# Patient Record
Sex: Male | Born: 1995 | Race: White | Hispanic: No | Marital: Single | Smoking: Never smoker
Health system: Southern US, Community
[De-identification: ages and names within clinical notes are randomized; demographics above are authoritative.]

## PROBLEM LIST (undated history)

## (undated) DIAGNOSIS — J4520 Mild intermittent asthma, uncomplicated: Secondary | ICD-10-CM

## (undated) DIAGNOSIS — Z8739 Personal history of other diseases of the musculoskeletal system and connective tissue: Secondary | ICD-10-CM

## (undated) DIAGNOSIS — J302 Other seasonal allergic rhinitis: Secondary | ICD-10-CM

## (undated) DIAGNOSIS — J301 Allergic rhinitis due to pollen: Secondary | ICD-10-CM

## (undated) DIAGNOSIS — F419 Anxiety disorder, unspecified: Secondary | ICD-10-CM

## (undated) DIAGNOSIS — F329 Major depressive disorder, single episode, unspecified: Secondary | ICD-10-CM

## (undated) DIAGNOSIS — H53002 Unspecified amblyopia, left eye: Secondary | ICD-10-CM

## (undated) DIAGNOSIS — K645 Perianal venous thrombosis: Secondary | ICD-10-CM

## (undated) DIAGNOSIS — F32A Depression, unspecified: Secondary | ICD-10-CM

## (undated) HISTORY — DX: Unspecified amblyopia, left eye: H53.002

## (undated) HISTORY — DX: Depression, unspecified: F32.A

## (undated) HISTORY — DX: Anxiety disorder, unspecified: F41.9

## (undated) HISTORY — DX: Allergic rhinitis due to pollen: J30.1

## (undated) HISTORY — DX: Perianal venous thrombosis: K64.5

## (undated) HISTORY — DX: Other seasonal allergic rhinitis: J30.2

## (undated) HISTORY — DX: Major depressive disorder, single episode, unspecified: F32.9

## (undated) HISTORY — DX: Mild intermittent asthma, uncomplicated: J45.20

## (undated) HISTORY — DX: Personal history of other diseases of the musculoskeletal system and connective tissue: Z87.39

---
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
## 2016-01-19 DIAGNOSIS — K645 Perianal venous thrombosis: Secondary | ICD-10-CM

## 2016-01-19 HISTORY — DX: Perianal venous thrombosis: K64.5

## 2016-01-25 ENCOUNTER — Encounter: Payer: Self-pay | Admitting: Family Medicine

## 2016-01-25 ENCOUNTER — Ambulatory Visit (INDEPENDENT_AMBULATORY_CARE_PROVIDER_SITE_OTHER): Payer: BLUE CROSS/BLUE SHIELD | Admitting: Family Medicine

## 2016-01-25 VITALS — BP 146/98 | HR 66 | Temp 98.1°F | Resp 16 | Ht 71.0 in | Wt 146.0 lb

## 2016-01-25 DIAGNOSIS — K645 Perianal venous thrombosis: Secondary | ICD-10-CM | POA: Diagnosis not present

## 2016-01-25 NOTE — Progress Notes (Signed)
OFFICE VISIT  01/25/2016   CC:  Chief Complaint  Patient presents with  . Rectal Pain    no pain at this moment start 4 days ago   HPI:    Patient is a 20 y.o. Caucasian male who presents for recent rectal pain. Onset of anal pain 4 d/a, felt a bump.  He says it felt like it was oozing some substance but not bleeding. Now, says it has gone down a lot and doesn't hurt at all except for when he wipes.  Past Medical History:  Diagnosis Date  . Amblyopia ex anopsia of left eye   . Anxiety   . Depression   . Hay fever   . History of Osgood-Schlatter disease    bilat     Past Surgical History:  Procedure Laterality Date  . TONSILLECTOMY  2006  . TYMPANOSTOMY TUBE PLACEMENT  2000  . WRIST FRACTURE SURGERY  2010    Outpatient Medications Prior to Visit  Medication Sig Dispense Refill  . cetirizine (ZYRTEC) 10 MG tablet Take 10 mg by mouth daily.    . Loratadine (CLARITIN PO) Take by mouth as needed.    . triamcinolone (NASACORT ALLERGY 24HR) 55 MCG/ACT AERO nasal inhaler Place 1 spray into the nose daily as needed.     No facility-administered medications prior to visit.     No Known Allergies  ROS As per HPI  PE: Blood pressure (!) 146/98, pulse 66, temperature 98.1 F (36.7 C), temperature source Oral, resp. rate 16, height 5\' 11"  (1.803 m), weight 146 lb (66.2 kg), SpO2 99 %.  Examination chaperoned by Zane HeraldJavier Guijoza, CMA. Gen: Alert, well appearing.  Patient is oriented to person, place, time, and situation. Anal exam: small, violaceous-colored, non-tender thrombosed hemorrhoid at 12 o'clock position.  No drainage or bleeding.    LABS:  none  IMPRESSION AND PLAN:  Thrombosed external hemorrhoid, resolving spontaneously. Encouraged daily 20 min sitz bath for the next 4-5 days. Signs/symptoms to call or return for were reviewed and pt expressed understanding.  An After Visit Summary was printed and given to the patient.  FOLLOW UP: Return if symptoms worsen  or fail to improve.  Signed:  Santiago BumpersPhil Chanika Byland, MD           01/25/2016

## 2016-01-25 NOTE — Patient Instructions (Signed)
Buy over the counter "Sitz Bath" and soak the area for 20 minutes at least once a day for the next 4-5 days.

## 2017-04-25 ENCOUNTER — Encounter: Payer: Self-pay | Admitting: Family Medicine

## 2017-04-25 ENCOUNTER — Ambulatory Visit: Payer: BLUE CROSS/BLUE SHIELD | Admitting: Family Medicine

## 2017-04-25 VITALS — BP 112/75 | HR 88 | Temp 97.9°F | Resp 16 | Wt 149.0 lb

## 2017-04-25 DIAGNOSIS — Z8739 Personal history of other diseases of the musculoskeletal system and connective tissue: Secondary | ICD-10-CM

## 2017-04-25 DIAGNOSIS — M21962 Unspecified acquired deformity of left lower leg: Secondary | ICD-10-CM | POA: Diagnosis not present

## 2017-04-25 DIAGNOSIS — M25561 Pain in right knee: Secondary | ICD-10-CM | POA: Diagnosis not present

## 2017-04-25 DIAGNOSIS — J302 Other seasonal allergic rhinitis: Secondary | ICD-10-CM | POA: Diagnosis not present

## 2017-04-25 DIAGNOSIS — G8929 Other chronic pain: Secondary | ICD-10-CM | POA: Diagnosis not present

## 2017-04-25 DIAGNOSIS — M21961 Unspecified acquired deformity of right lower leg: Secondary | ICD-10-CM | POA: Diagnosis not present

## 2017-04-25 DIAGNOSIS — J0100 Acute maxillary sinusitis, unspecified: Secondary | ICD-10-CM

## 2017-04-25 DIAGNOSIS — M25562 Pain in left knee: Secondary | ICD-10-CM

## 2017-04-25 MED ORDER — FLUTICASONE PROPIONATE 50 MCG/ACT NA SUSP: 2.0000 | Freq: Every day | NASAL | 6 refills | Status: DC

## 2017-04-25 MED ORDER — AMOXICILLIN 875 MG PO TABS: 875.0000 mg | ORAL_TABLET | Freq: Two times a day (BID) | ORAL | 0 refills | Status: AC

## 2017-04-25 NOTE — Progress Notes (Signed)
OFFICE VISIT  04/27/2017   CC:  Chief Complaint  Patient presents with  . Allergic Rhinitis     sneezing, runny nose, head congestion   HPI:    Patient is a 21 y.o. Caucasian male who presents for "allergies". Nasal congestion, sneezing, blowing nose all day, forehead headaches.  Face pressure+. Works around a lot of dust in [employer].  Onset about 3 wks ago, getting worse. Claritin not helping. Onset of ST yesterday.  No cough or fevers or wheezing.  No pain in face or upper teeth. Feels change in sense of smell.  Taste normal.    Both knees hurt, much of the time brought on by bending deeply while working or perching on knee briefly while working. No redness or effusion or heat.  No hx of injury to either knee. Hx of osgood schlatter dz.    Past Medical History:  Diagnosis Date  . Amblyopia ex anopsia of left eye   . Anxiety   . Depression   . Hay fever   . History of Osgood-Schlatter disease    bilat     Past Surgical History:  Procedure Laterality Date  . TONSILLECTOMY  2006  . TYMPANOSTOMY TUBE PLACEMENT  2000  . WRIST FRACTURE SURGERY  2010    Outpatient Medications Prior to Visit  Medication Sig Dispense Refill  . Loratadine (CLARITIN PO) Take by mouth as needed.    . cetirizine (ZYRTEC) 10 MG tablet Take 10 mg by mouth daily.    Marland Kitchen. triamcinolone (NASACORT ALLERGY 24HR) 55 MCG/ACT AERO nasal inhaler Place 1 spray into the nose daily as needed.     No facility-administered medications prior to visit.     No Known Allergies  ROS As per HPI  PE: Blood pressure 112/75, pulse 88, temperature 97.9 F (36.6 C), temperature source Oral, resp. rate 16, weight 149 lb (67.6 kg), SpO2 100 %. VS: noted--normal. Gen: alert, NAD, NONTOXIC APPEARING. HEENT: eyes without injection, drainage, or swelling.  Ears: EACs clear, TMs with normal light reflex and landmarks.  Nose: Clear rhinorrhea, with some dried, crusty exudate adherent to mildly injected mucosa.  No  purulent d/c.  No paranasal sinus TTP.  No facial swelling.  Throat and mouth without focal lesion.  No pharyngial swelling, erythema, or exudate.   Neck: supple, no LAD.   LUNGS: CTA bilat, nonlabored resps.   CV: RRR, no m/r/g. EXT: no c/c/e SKIN: no rash  Knees: large bony protrusion deformity at tibial tuberosity bilat, L>R.  No tenderness, no soft tissue swelling or effusion, no instability.  ROM intact.  Patellar grind neg.    LABS:  none  IMPRESSION AND PLAN:  1) Allergic rhinitis with superimposed acute sinusitis: amoxil 875mg  bid x 10d.  Add flonase qd.  Continue claritin.  2) Bilat knee bony deformities: at the areas of tibial tuberosity bilat, secondary to hx of osgood schlatter dz. Bothersome knee pains frequently--related to this problem. Check x-ray of both knees. Referred to ortho: Dr. Romeo [place]Harrison in [place].  An After Visit Summary was printed and given to the patient.  FOLLOW UP: No Follow-up on file.PRN  Signed:  Santiago BumpersPhil Casey Maxfield, MD           04/27/2017

## 2017-05-01 ENCOUNTER — Ambulatory Visit (HOSPITAL_COMMUNITY)
Admission: RE | Admit: 2017-05-01 | Discharge: 2017-05-01 | Disposition: A | Discharge status: Home / Self Care | Payer: BLUE CROSS/BLUE SHIELD | Source: Ambulatory Visit | Attending: Family Medicine | Admitting: Family Medicine

## 2017-05-01 DIAGNOSIS — Z8739 Personal history of other diseases of the musculoskeletal system and connective tissue: Secondary | ICD-10-CM | POA: Diagnosis not present

## 2017-05-01 DIAGNOSIS — G8929 Other chronic pain: Secondary | ICD-10-CM | POA: Diagnosis not present

## 2017-05-01 DIAGNOSIS — M25562 Pain in left knee: Secondary | ICD-10-CM | POA: Diagnosis not present

## 2017-05-01 DIAGNOSIS — M25561 Pain in right knee: Secondary | ICD-10-CM | POA: Diagnosis not present

## 2017-05-07 ENCOUNTER — Ambulatory Visit: Payer: BLUE CROSS/BLUE SHIELD | Admitting: Orthopaedic Surgery

## 2017-05-07 ENCOUNTER — Encounter: Payer: Self-pay | Admitting: Orthopaedic Surgery

## 2017-05-07 VITALS — BP 120/76 | HR 85 | Temp 97.3°F | Ht 72.0 in | Wt 150.0 lb

## 2017-05-07 DIAGNOSIS — G8929 Other chronic pain: Secondary | ICD-10-CM

## 2017-05-07 DIAGNOSIS — M25562 Pain in left knee: Secondary | ICD-10-CM | POA: Diagnosis not present

## 2017-05-07 DIAGNOSIS — M25561 Pain in right knee: Secondary | ICD-10-CM | POA: Diagnosis not present

## 2017-05-07 MED ORDER — NAPROXEN 500 MG PO TABS: 500.0000 mg | ORAL_TABLET | Freq: Two times a day (BID) | ORAL | 5 refills | Status: DC

## 2017-05-07 NOTE — Patient Instructions (Signed)

## 2017-05-07 NOTE — Progress Notes (Signed)
Subjective:    Patient ID: Kyle MooresDustin C Rahimi, male    DOB: 10/24/1995, 21 y.o.   MRN: [id number]  HPI He has had pain in the knees for years. He had Osgood-Schlatters on the left knee and has a bony prominence.  He has pain when he squats for a while. 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  He has no trauma.  He has had x-rays which were negative.  He has tried some Advil at times and it helps.  He has no swelling, no giving way, no locking, no redness. He has no other joint pains.  He has no trauma.  He has no popping, no redness.     Review of Systems  Musculoskeletal: Positive for arthralgias.  Allergic/Immunologic: Positive for environmental allergies.  All other systems reviewed and are negative.  Past Medical History:  Diagnosis Date  . Amblyopia ex anopsia of left eye   . Anxiety   . Depression   . Hay fever   . History of Osgood-Schlatter disease    bilat     Past Surgical History:  Procedure Laterality Date  . TONSILLECTOMY  2006  . TYMPANOSTOMY TUBE PLACEMENT  2000  . WRIST FRACTURE SURGERY  2010    Current Outpatient Medications on File Prior to Visit  Medication Sig Dispense Refill  . fluticasone (FLONASE) 50 MCG/ACT nasal spray Place 2 sprays into both nostrils daily. 16 g 6  . Loratadine (CLARITIN PO) Take by mouth as needed.    . cetirizine (ZYRTEC) 10 MG tablet Take 10 mg by mouth daily.    Marland Kitchen. triamcinolone (NASACORT ALLERGY 24HR) 55 MCG/ACT AERO nasal inhaler Place 1 spray into the nose daily as needed.     No current facility-administered medications on file prior to visit.     Social History   Socioeconomic History  . Marital status: Single    Spouse name: Not on file  . Number of children: Not on file  . Years of education: Not on file  . Highest education level: Not on file  Social Needs  . Financial resource strain: Not on file  . Food insecurity - worry: Not on file  . Food insecurity - inability: Not on file    . Transportation needs - medical: Not on file  . Transportation needs - non-medical: Not on file  Occupational History  . Not on file  Tobacco Use  . Smoking status: Never Smoker  . Smokeless tobacco: Never Used  Substance and Sexual Activity  . Alcohol use: No  . Drug use: No  . Sexual activity: No  Other Topics Concern  . Not on file  Social History Narrative   [place]ives [place], dad in [place], [place].  His older brother committed suicide.   [employer] grad.   No tob.   Alc: none   Drugs: no    Family History  Problem Relation Age of Onset  . Alcohol abuse Mother   . Drug abuse Mother   . Cancer Mother   . Depression Brother   . Allergies Brother   . Suicidality Brother   . Cancer Maternal Grandfather   . Hypertension Paternal Grandmother   . Cancer Paternal Grandfather   . Hypertension Paternal Grandfather   . Diabetes Paternal Grandfather   . Cancer Maternal Uncle     BP 120/76   Pulse 85   Temp (!) 97.3 F (36.3 C)   Ht 6' (1.829 m)  Wt 150 lb (68 kg)   BMI 20.34 kg/m      Objective:   Physical Exam  Constitutional: He is oriented to person, place, and time. He appears well-developed and well-nourished.  HENT:  Head: Normocephalic and atraumatic.  Eyes: Conjunctivae and EOM are normal. Pupils are equal, round, and reactive to light.  Neck: Normal range of motion. Neck supple.  Cardiovascular: Normal rate, regular rhythm and intact distal pulses.  Pulmonary/Chest: Effort normal.  Abdominal: Soft.  Musculoskeletal: He exhibits tenderness (Both knees have full ROM, no crepitus, no pain.  He has prominent tibial tuburcle on the left knee but no pain or redness.  Gait normal.  Knees stable.  NV intact.).  Neurological: He is alert and oriented to person, place, and time. He has normal reflexes. He displays normal reflexes. No cranial nerve deficit. He exhibits normal muscle tone. Coordination normal.  Skin: Skin is warm and dry.  Psychiatric: He has a  normal mood and affect. His behavior is normal. Judgment and thought content normal.  Vitals reviewed.   I reviewed his prior x-rays and reports and notes from his family doctor.      Assessment & Plan:   Encounter Diagnosis  Name Primary?  . Bilateral chronic knee pain Yes   I will begin Naprosyn 500 po bid pc.  Precautions given.  He may want to consider other types of work long term.  Return in three weeks.  Call if any problem.  Precautions discussed.   Electronically Signed Darreld McleanWayne Gracen Southwell, MD 12/19/20189:00 AM

## 2017-05-28 ENCOUNTER — Ambulatory Visit: Payer: BLUE CROSS/BLUE SHIELD | Admitting: Orthopaedic Surgery

## 2017-05-28 ENCOUNTER — Encounter: Payer: Self-pay | Admitting: Orthopaedic Surgery

## 2017-05-28 VITALS — BP 127/81 | HR 84 | Ht 72.0 in | Wt 150.0 lb

## 2017-05-28 DIAGNOSIS — M25562 Pain in left knee: Secondary | ICD-10-CM

## 2017-05-28 DIAGNOSIS — M25561 Pain in right knee: Secondary | ICD-10-CM

## 2017-05-28 DIAGNOSIS — G8929 Other chronic pain: Secondary | ICD-10-CM

## 2017-05-28 NOTE — Progress Notes (Signed)
CC:  My knee is much better  He took the Naprosyn.  His knee pain is much improved.  He has no pain today, full ROM, normal gait, no swelling.  I have told him he can stop the Naprosyn.  He can take Aleve in the future if any problem.  I will see him as needed.  Encounter Diagnosis  Name Primary?  . Bilateral chronic knee pain Yes   Call if any problem.  Precautions discussed.   Electronically Signed Darreld McleanWayne Oral Hallgren, MD 1/9/20199:13 AM

## 2017-06-02 ENCOUNTER — Encounter: Payer: Self-pay | Admitting: Family Medicine

## 2017-07-28 ENCOUNTER — Ambulatory Visit: Payer: BLUE CROSS/BLUE SHIELD | Admitting: Family Medicine

## 2017-07-28 ENCOUNTER — Encounter: Payer: Self-pay | Admitting: Family Medicine

## 2017-07-28 VITALS — BP 117/81 | HR 90 | Temp 98.0°F | Resp 16 | Ht 72.0 in | Wt 147.0 lb

## 2017-07-28 DIAGNOSIS — R69 Illness, unspecified: Secondary | ICD-10-CM | POA: Diagnosis not present

## 2017-07-28 DIAGNOSIS — R110 Nausea: Secondary | ICD-10-CM | POA: Diagnosis not present

## 2017-07-28 DIAGNOSIS — R42 Dizziness and giddiness: Secondary | ICD-10-CM | POA: Diagnosis not present

## 2017-07-28 DIAGNOSIS — J111 Influenza due to unidentified influenza virus with other respiratory manifestations: Secondary | ICD-10-CM

## 2017-07-28 LAB — POC INFLUENZA A&B (BINAX/QUICKVUE)
INFLUENZA A, POC: NEGATIVE
Influenza B, POC: NEGATIVE

## 2017-07-28 MED ORDER — HYDROCODONE-HOMATROPINE 5-1.5 MG/5ML PO SYRP: ORAL_SOLUTION | ORAL | 0 refills | Status: DC

## 2017-07-28 NOTE — Patient Instructions (Signed)
Get otc generic robitussin DM OR Mucinex DM and use as directed on the packaging for cough and congestion. Use otc generic saline nasal spray 2-3 times per day to irrigate/moisturize your nasal passages.   

## 2017-07-28 NOTE — Progress Notes (Signed)
OFFICE VISIT  07/28/2017   CC:  Chief Complaint  Patient presents with  . Headache    sore throat, cough     HPI:    Patient is a 22 y.o. Caucasian male who presents for 4-5 d HA and nausea and nasal and sinus congestion,PND, ST, alt cold/sweating, cough, dizzy when stands up.  No vomiting.  No diarrhea.  Eating fine. Body aches some, fatigued.   No otc meds tried. +Contacts with flu.  No flu vaccine this season.  Past Medical History:  Diagnosis Date  . Amblyopia ex anopsia of left eye   . Anxiety   . Depression   . Hay fever   . History of Osgood-Schlatter disease    bilat; chronic bilat knee pain--saw Dr. Hilda LiasKeeling 2018---improved signif with naprosyn.    Past Surgical History:  Procedure Laterality Date  . TONSILLECTOMY  2006  . TYMPANOSTOMY TUBE PLACEMENT  2000  . WRIST FRACTURE SURGERY  2010    Outpatient Medications Prior to Visit  Medication Sig Dispense Refill  . fluticasone (FLONASE) 50 MCG/ACT nasal spray Place 2 sprays into both nostrils daily. 16 g 6  . Loratadine (CLARITIN PO) Take by mouth as needed.    . cetirizine (ZYRTEC) 10 MG tablet Take 10 mg by mouth daily.    . naproxen (NAPROSYN) 500 MG tablet Take 1 tablet (500 mg total) by mouth 2 (two) times daily with a meal. (Patient not taking: Reported on 07/28/2017) 60 tablet 5  . triamcinolone (NASACORT ALLERGY 24HR) 55 MCG/ACT AERO nasal inhaler Place 1 spray into the nose daily as needed.     No facility-administered medications prior to visit.     No Known Allergies  ROS As per HPI  PE: Blood pressure 117/81, pulse 90, temperature 98 F (36.7 C), temperature source Oral, resp. rate 16, height 6' (1.829 m), weight 147 lb (66.7 kg), SpO2 98 %. VS: noted--normal. Gen: alert, NAD, NONTOXIC APPEARING. HEENT: eyes without injection, drainage, or swelling.  Ears: EACs clear, TMs with normal light reflex and landmarks.  Nose: Clear rhinorrhea, with some dried, crusty exudate adherent to mildly injected  mucosa.  No purulent d/c.  No paranasal sinus TTP.  No facial swelling.  Throat and mouth without focal lesion.  No pharyngial swelling, erythema, or exudate.   Neck: supple, no LAD.   LUNGS: CTA bilat, nonlabored resps.   CV: RRR, no m/r/g. EXT: no c/c/e SKIN: no rash  LABS:  Rapid Flu A/B today: NEG  IMPRESSION AND PLAN:  Influenza-like illness.  Improved a little bit the last 24h. Push fluids, rest. Get otc generic robitussin DM OR Mucinex DM and use as directed on the packaging for cough and congestion. Use otc generic saline nasal spray 2-3 times per day to irrigate/moisturize your nasal passages. Hycodan syrup 1-2 tsp po qhs prn, #120 ml.  Therapeutic expectations and side effect profile of medication discussed today.  Patient's questions answered.  An After Visit Summary was printed and given to the patient.  FOLLOW UP: Return if symptoms worsen or fail to improve.  Signed:  Santiago BumpersPhil Tu Shimmel, MD           07/28/2017

## 2017-10-19 ENCOUNTER — Other Ambulatory Visit: Payer: Self-pay | Admitting: Orthopaedic Surgery

## 2017-11-04 ENCOUNTER — Ambulatory Visit: Payer: Self-pay | Admitting: *Deleted

## 2017-11-04 NOTE — Telephone Encounter (Signed)
Noted  

## 2017-11-04 NOTE — Telephone Encounter (Signed)
Pt scheduled to see Dr. Milinda CaveMcGowen on 11/05/17 at 2:00pm.

## 2017-11-04 NOTE — Telephone Encounter (Signed)
Pt. Calling needing to speak with nurse about perineal abscess bleeding (no fever, no chills, no other symptoms) NT busy      Reason for Disposition . [1] Wound present > 48 hours AND [2] becomes more painful  Answer Assessment - Initial Assessment Questions 1. LOCATION: "Where is the wound located?"      Perineal area 2. WOUND APPEARANCE: "What does the wound look like?"      Large- quarter size- may have 2 areas 3. SIZE: If redness is present, ask: "What is the size of the red area?" (Inches, centimeters, or compare to size of a coin)      Presently draining blood 4. SPREAD: "What's changed in the last day?"      no 5. ONSET: "When did it start to look infected?"      Noticed 1 week ago 6. PAIN: "Is there any pain?" If so, ask: "How bad is the pain?"      Yes- pain and discomfort 7. FEVER: "Does your child have a fever?" If so, ask: "What is it, how was it measured, and how long has it been present?"      No fever 8. CHILD'S APPEARANCE: "How sick is your child acting?" " What is he doing right now?" If asleep, ask: "How was he acting before he went to sleep?"     n/a  Protocols used: WOUND INFECTION SUSPECTED-P-AH

## 2017-11-05 ENCOUNTER — Ambulatory Visit: Payer: BLUE CROSS/BLUE SHIELD | Admitting: Family Medicine

## 2017-11-05 ENCOUNTER — Encounter: Payer: Self-pay | Admitting: Family Medicine

## 2017-11-05 VITALS — BP 108/69 | HR 62 | Temp 98.1°F | Resp 16 | Ht 72.0 in | Wt 143.1 lb

## 2017-11-05 DIAGNOSIS — K645 Perianal venous thrombosis: Secondary | ICD-10-CM | POA: Diagnosis not present

## 2017-11-05 DIAGNOSIS — K5909 Other constipation: Secondary | ICD-10-CM | POA: Diagnosis not present

## 2017-11-05 NOTE — Patient Instructions (Signed)
Buy over the counter Sitz bath and put warm water with epsom salts in it: sit in this for 20 minutes twice a day until symptoms resolve.  Buy generic over the counter miralax powder, use 1 capful in 8 oz of water once daily for constipation.

## 2017-11-05 NOTE — Progress Notes (Signed)
OFFICE VISIT  11/05/2017   CC:  Chief Complaint  Patient presents with  . perianal cyst   HPI:    Patient is a 22 y.o. Caucasian male who presents accompanied by his girlfriend for "cyst". Onset about a week ago, pain on outer edge of anus.  No f/c/malaise.   Got worse pain 4 d/a and has been draining bloody substance since then.   No soaking but cleaning with peroxide and this helps.  Had to leave work early on Sunday, June 16th and needs MD note.  Past Medical History:  Diagnosis Date  . Amblyopia ex anopsia of left eye   . Anxiety   . Depression   . External hemorrhoid, thrombosed 01/2016   No I&D required.  . Hay fever   . History of Osgood-Schlatter disease    bilat; chronic bilat knee pain--saw Dr. Hilda LiasKeeling 2018---improved signif with naprosyn.    Past Surgical History:  Procedure Laterality Date  . TONSILLECTOMY  2006  . TYMPANOSTOMY TUBE PLACEMENT  2000  . WRIST FRACTURE SURGERY  2010    Outpatient Medications Prior to Visit  Medication Sig Dispense Refill  . fluticasone (FLONASE) 50 MCG/ACT nasal spray Place 2 sprays into both nostrils daily. 16 g 6  . Loratadine (CLARITIN PO) Take by mouth as needed.    . naproxen (NAPROSYN) 500 MG tablet TAKE 1 TABLET (500 MG TOTAL) BY MOUTH 2 (TWO) TIMES DAILY WITH A MEAL. 60 tablet 4  . cetirizine (ZYRTEC) 10 MG tablet Take 10 mg by mouth daily.    Marland Kitchen. HYDROcodone-homatropine (HYCODAN) 5-1.5 MG/5ML syrup 1-2 tsp po qhs prn cough and headache (Patient not taking: Reported on 11/05/2017) 120 mL 0  . triamcinolone (NASACORT ALLERGY 24HR) 55 MCG/ACT AERO nasal inhaler Place 1 spray into the nose daily as needed.     No facility-administered medications prior to visit.     No Known Allergies  ROS As per HPI  PE: Blood pressure 108/69, pulse 62, temperature 98.1 F (36.7 C), temperature source Oral, resp. rate 16, height 6' (1.829 m), weight 143 lb 2 oz (64.9 kg), SpO2 97 %. Gen: Alert, well appearing.  Patient is oriented to  person, place, time, and situation. AFFECT: pleasant, lucid thought and speech. Anus: at 12 oclock position of anal opening there is a bee-bee sized thrombosed external hemorrhoid with mild TTP.  This has a central portion of erosion with dried blood. A couple of other non-thrombosed hemorrhoids are present.   No anal fissure.  No cysts.  LABS:  none  IMPRESSION AND PLAN:  Small thrombosed external hemorrhoid---almost resolved at this point in time. Discussed sitz bath with warm epsom salt 20 min bid. He does have constipation contributing to development of hemorrhoids: start miralax 1 capful qd, titrate as appropriate. An After Visit Summary was printed and given to the patient.  FOLLOW UP: Return if symptoms worsen or fail to improve.  Signed:  Santiago BumpersPhil Ivaan Liddy, MD           11/05/2017

## 2017-11-21 ENCOUNTER — Ambulatory Visit (INDEPENDENT_AMBULATORY_CARE_PROVIDER_SITE_OTHER): Payer: Managed Care, Other (non HMO) | Admitting: Physician Assistant

## 2017-11-21 ENCOUNTER — Other Ambulatory Visit: Payer: Self-pay

## 2017-11-21 ENCOUNTER — Ambulatory Visit: Payer: BLUE CROSS/BLUE SHIELD | Admitting: Physician Assistant

## 2017-11-21 ENCOUNTER — Encounter: Payer: Self-pay | Admitting: Physician Assistant

## 2017-11-21 VITALS — BP 110/82 | HR 74 | Temp 97.4°F | Resp 14 | Ht 72.0 in | Wt 143.0 lb

## 2017-11-21 DIAGNOSIS — B880 Other acariasis: Secondary | ICD-10-CM

## 2017-11-21 MED ORDER — PERMETHRIN 5 % EX CREA: 1.0000 "application " | TOPICAL_CREAM | Freq: Once | CUTANEOUS | 0 refills | Status: AC

## 2017-11-21 MED ORDER — HYDROXYZINE HCL 25 MG PO TABS
25.0000 mg | ORAL_TABLET | Freq: Three times a day (TID) | ORAL | 0 refills | Status: DC | PRN
Start: 2017-11-21 — End: 2018-05-07

## 2017-11-21 NOTE — Patient Instructions (Signed)
Please wash all potential exposed clothing/linens in hot water and dry before reusing.   Apply the permethrin cream to the legs and wash off 8 hours later (just in case you also have picked up something from the pets). Make sure to give the pets flea baths!  Use the hydroxyzine as directed to help calm the itch down.  It will take a couple of weeks for symptoms to completely resolve but you should not note any worsening symptoms. If you do, please call or come see us. My office number in [phone]

## 2017-11-21 NOTE — Progress Notes (Signed)
Patient presents to clinic today c/o itching rash of ankles bilaterally x 2 days after having to go through a field. Has started noting some lesion of shins bilaterally but none elsewhere. Denies fever, chills, malaise or fatigue. Denies recent travel or contact with similar symptoms. Has 2 pets that previously had fleas but they are being treated for this. They did note seeing small brown-orange "bugs" crawling on patient after being in the field.   Past Medical History:  Diagnosis Date  . Amblyopia ex anopsia of left eye   . Anxiety   . Depression   . External hemorrhoid, thrombosed 01/2016   No I&D required.  . Hay fever   . History of Osgood-Schlatter disease    bilat; chronic bilat knee pain--saw Dr. Hilda Lias 2018---improved signif with naprosyn.    Current Outpatient Medications on File Prior to Visit  Medication Sig Dispense Refill  . fluticasone (FLONASE) 50 MCG/ACT nasal spray Place 2 sprays into both nostrils daily. 16 g 6  . Loratadine (CLARITIN PO) Take by mouth as needed.    . naproxen (NAPROSYN) 500 MG tablet TAKE 1 TABLET (500 MG TOTAL) BY MOUTH 2 (TWO) TIMES DAILY WITH A MEAL. 60 tablet 4   No current facility-administered medications on file prior to visit.     No Known Allergies  Family History  Problem Relation Age of Onset  . Alcohol abuse Mother   . Drug abuse Mother   . Cancer Mother   . Depression Brother   . Allergies Brother   . Suicidality Brother   . Cancer Maternal Grandfather   . Hypertension Paternal Grandmother   . Cancer Paternal Grandfather   . Hypertension Paternal Grandfather   . Diabetes Paternal Grandfather   . Cancer Maternal Uncle     Social History   Socioeconomic History  . Marital status: Single    Spouse name: Not on file  . Number of children: Not on file  . Years of education: Not on file  . Highest education level: Not on file  Occupational History  . Not on file  Social Needs  . Financial resource strain: Not on  file  . Food insecurity:    Worry: Not on file    Inability: Not on file  . Transportation needs:    Medical: Not on file    Non-medical: Not on file  Tobacco Use  . Smoking status: Never Smoker  . Smokeless tobacco: Never Used  Substance and Sexual Activity  . Alcohol use: No  . Drug use: No  . Sexual activity: Never  Lifestyle  . Physical activity:    Days per week: Not on file    Minutes per session: Not on file  . Stress: Not on file  Relationships  . Social connections:    Talks on phone: Not on file    Gets together: Not on file    Attends religious service: Not on file    Active member of club or organization: Not on file    Attends meetings of clubs or organizations: Not on file    Relationship status: Not on file  Other Topics Concern  . Not on file  Social History Narrative   Lives with mom, dad in [place], [place].  His older brother committed suicide.   [employer] grad.   No tob.   Alc: none   Drugs: no   Review of Systems - See HPI.  All other ROS are negative.  BP 110/82   Pulse 74  Temp (!) 97.4 F (36.3 C) (Oral)   Resp 14   Ht 6' (1.829 m)   Wt 143 lb (64.9 kg)   SpO2 99%   BMI 19.39 kg/m   Physical Exam  Constitutional: He is oriented to person, place, and time. He appears well-developed and well-nourished.  Eyes: Conjunctivae are normal.  Cardiovascular: Normal rate.  Pulmonary/Chest: Effort normal.  Neurological: He is alert and oriented to person, place, and time.  Skin: Rash (scattered erythematous papules of bilateral ankles and shins without sign of superimposed cellulitis, concerning for bites, most-likely chigger bites based on appearance) noted.  Vitals reviewed.   Assessment/Plan: 1. Chigger bites Patient has two pets that are being treated for fleas but he has not had any issue the entire time before or during treatment. Bites seem most consistent with chigger bites. Will start supportive measures and Rx hydroxyzine. Will  given Rx permethrin to apply from legs down as well just in case they are any mites present. Follow-up if not improving. - hydrOXYzine (ATARAX/VISTARIL) 25 MG tablet; Take 1 tablet (25 mg total) by mouth 3 (three) times daily as needed.  Dispense: 30 tablet; Refill: 0 - permethrin (ELIMITE) 5 % cream; Apply 1 application topically once for 1 dose. Wash off in 8 hours  Dispense: 60 g; Refill: 0   Piedad ClimesWilliam Cody Jana Swartzlander, [place] JerseyPA-C

## 2018-02-17 ENCOUNTER — Ambulatory Visit (INDEPENDENT_AMBULATORY_CARE_PROVIDER_SITE_OTHER): Payer: Managed Care, Other (non HMO) | Admitting: Family Medicine

## 2018-02-17 ENCOUNTER — Encounter: Payer: Self-pay | Admitting: Family Medicine

## 2018-02-17 ENCOUNTER — Encounter: Payer: Self-pay | Admitting: *Deleted

## 2018-02-17 VITALS — BP 110/69 | HR 69 | Temp 98.2°F | Resp 16 | Ht 72.0 in | Wt 146.0 lb

## 2018-02-17 DIAGNOSIS — R112 Nausea with vomiting, unspecified: Secondary | ICD-10-CM | POA: Diagnosis not present

## 2018-02-17 DIAGNOSIS — K2900 Acute gastritis without bleeding: Secondary | ICD-10-CM

## 2018-02-17 MED ORDER — ONDANSETRON HCL 8 MG PO TABS: 8.0000 mg | ORAL_TABLET | Freq: Three times a day (TID) | ORAL | 0 refills | Status: DC | PRN

## 2018-02-17 NOTE — Patient Instructions (Signed)

## 2018-02-17 NOTE — Progress Notes (Signed)
OFFICE VISIT  02/17/2018   CC:  Chief Complaint  Patient presents with  . Emesis    HPI:    Patient is a 22 y.o. Caucasian male who presents for nausea and vomiting. On the [place] from the beach yesterday he started feeling nauseated and started vomiting--2-3 times total, nonbilious/nonbloody.   Crampy generalized abd pain peaks when he vomits, nearly completely resolves when he is finished vomiting. No fevers.  No diarrhea. He can drink water, sipping frequently to try to keep hydrated. Rested bowels last night, tried one bite of a sausage biscuit this morning and did not want anymore--no vomiting, though. No HA, ST, body aches, or rash.  No known sick contacts. No meds taken to help it.  No new meds.  No naproxen in several days.  Passing gas fine.  No recent alcohol.   Past Medical History:  Diagnosis Date  . Amblyopia ex anopsia of left eye   . Anxiety   . Depression   . External hemorrhoid, thrombosed 01/2016   No I&D required.  . Hay fever   . History of Osgood-Schlatter disease    bilat; chronic bilat knee pain--saw Dr. Hilda Lias 2018---improved signif with naprosyn.    Past Surgical History:  Procedure Laterality Date  . TONSILLECTOMY  2006  . TYMPANOSTOMY TUBE PLACEMENT  2000  . WRIST FRACTURE SURGERY  2010    Outpatient Medications Prior to Visit  Medication Sig Dispense Refill  . fluticasone (FLONASE) 50 MCG/ACT nasal spray Place 2 sprays into both nostrils daily. 16 g 6  . hydrOXYzine (ATARAX/VISTARIL) 25 MG tablet Take 1 tablet (25 mg total) by mouth 3 (three) times daily as needed. 30 tablet 0  . Loratadine (CLARITIN PO) Take by mouth as needed.    . naproxen (NAPROSYN) 500 MG tablet TAKE 1 TABLET (500 MG TOTAL) BY MOUTH 2 (TWO) TIMES DAILY WITH A MEAL. 60 tablet 4  . permethrin (ELIMITE) 5 % cream APPLY 1 APPLICATION TOPICALLY ONCE FOR 1 DOSE. WASH OFF IN 8 HOURS  0   No facility-administered medications prior to visit.     No Known  Allergies  ROS As per HPI  PE: Blood pressure 110/69, pulse 69, temperature 98.2 F (36.8 C), temperature source Oral, resp. rate 16, height 6' (1.829 m), weight 146 lb (66.2 kg), SpO2 98 %. Body mass index is 19.8 kg/m.  Gen: Alert, well appearing.  Patient is oriented to person, place, time, and situation. AFFECT: pleasant, lucid thought and speech. UEA:VWUJ: no injection, icteris, swelling, or exudate.  EOMI, PERRLA. Mouth: lips without lesion/swelling.  Oral mucosa pink and moist. Oropharynx without erythema, exudate, or swelling.  Neck - No masses or thyromegaly or limitation in range of motion CV: RRR, no m/r/g.   LUNGS: CTA bilat, nonlabored resps, good aeration in all lung fields. ABD: soft, nondistended, BS hypoactive, mild diffuse abd TTP w/out guarding or rebound.  No mass, no HSM, no bruit. EXT: no clubbing or cyanosis.  no edema.  Skin - no sores or suspicious lesions or rashes or color changes   LABS:    Chemistry   No results found for: NA, K, CL, CO2, BUN, CREATININE, GLU No results found for: CALCIUM, ALKPHOS, AST, ALT, BILITOT    IMPRESSION AND PLAN:  Acute n/v illness c/w gastritis/gastroenteritis, suspect viral etiology. He does not appear dehydrated. Plan: push clear fluids slowly.  If tolerates this w/out vomiting through today, start bland diet tonight---handout discussed/given. Zofran 8 mg tid prn, #20, no RF. Signs/symptoms  to call or return for were reviewed and pt expressed understanding.   An After Visit Summary was printed and given to the patient.  FOLLOW UP: Return if symptoms worsen or fail to improve.  Signed:  Santiago Bumpers, MD           02/17/2018

## 2018-05-07 ENCOUNTER — Encounter: Payer: Self-pay | Admitting: Family Medicine

## 2018-05-07 ENCOUNTER — Ambulatory Visit (INDEPENDENT_AMBULATORY_CARE_PROVIDER_SITE_OTHER): Payer: Managed Care, Other (non HMO) | Admitting: Family Medicine

## 2018-05-07 VITALS — BP 112/75 | HR 75 | Temp 98.4°F | Resp 16 | Ht 72.0 in | Wt 146.1 lb

## 2018-05-07 DIAGNOSIS — J069 Acute upper respiratory infection, unspecified: Secondary | ICD-10-CM

## 2018-05-07 DIAGNOSIS — J3089 Other allergic rhinitis: Secondary | ICD-10-CM | POA: Diagnosis not present

## 2018-05-07 DIAGNOSIS — J029 Acute pharyngitis, unspecified: Secondary | ICD-10-CM

## 2018-05-07 LAB — POCT RAPID STREP A (OFFICE): Rapid Strep A Screen: NEGATIVE

## 2018-05-07 MED ORDER — FLUTICASONE PROPIONATE 50 MCG/ACT NA SUSP: 2.0000 | Freq: Every day | NASAL | 6 refills | Status: AC

## 2018-05-07 NOTE — Progress Notes (Signed)
OFFICE VISIT  05/07/2018   CC:  Chief Complaint  Patient presents with  . URI    HPI:    Patient is a 22 y.o. Caucasian male who presents for respiratory complaints. Onset 6 d/a he woke up with sore throat, no voice.  Still has ST but it is a bit milder.  His voice returned today. Runny nose, sneezing, stuffy nose with PND have also been noted..  No ear pain.  Some cough in the last day or two--"real light cough"--nonproductive.  No fevers. Tried benadryl, claritin, sudafed-->minimal help. No known contacts with similar sx's. Works in very dusty environment. Ran out of flonase a few weeks ago.  Past Medical History:  Diagnosis Date  . Amblyopia ex anopsia of left eye   . Anxiety   . Depression   . External hemorrhoid, thrombosed 01/2016   No I&D required.  . Hay fever   . History of Osgood-Schlatter disease    bilat; chronic bilat knee pain--saw Dr. Hilda LiasKeeling 2018---improved signif with naprosyn.    Past Surgical History:  Procedure Laterality Date  . TONSILLECTOMY  2006  . TYMPANOSTOMY TUBE PLACEMENT  2000  . WRIST FRACTURE SURGERY  2010    Outpatient Medications Prior to Visit  Medication Sig Dispense Refill  . Loratadine (CLARITIN PO) Take by mouth as needed.    . naproxen (NAPROSYN) 500 MG tablet TAKE 1 TABLET (500 MG TOTAL) BY MOUTH 2 (TWO) TIMES DAILY WITH A MEAL. 60 tablet 4  . fluticasone (FLONASE) 50 MCG/ACT nasal spray Place 2 sprays into both nostrils daily. (Patient not taking: Reported on 05/07/2018) 16 g 6  . hydrOXYzine (ATARAX/VISTARIL) 25 MG tablet Take 1 tablet (25 mg total) by mouth 3 (three) times daily as needed. (Patient not taking: Reported on 05/07/2018) 30 tablet 0  . ondansetron (ZOFRAN) 8 MG tablet Take 1 tablet (8 mg total) by mouth every 8 (eight) hours as needed for nausea or vomiting. (Patient not taking: Reported on 05/07/2018) 20 tablet 0  . permethrin (ELIMITE) 5 % cream APPLY 1 APPLICATION TOPICALLY ONCE FOR 1 DOSE. WASH OFF IN 8 HOURS   0   No facility-administered medications prior to visit.     No Known Allergies  ROS As per HPI  PE: Blood pressure 112/75, pulse 75, temperature 98.4 F (36.9 C), temperature source Oral, resp. rate 16, height 6' (1.829 m), weight 146 lb 2 oz (66.3 kg), SpO2 99 %. VS: noted--normal. Gen: alert, NAD, NONTOXIC APPEARING. HEENT: eyes without injection, drainage, or swelling.  Ears: EACs clear, TMs with normal light reflex and landmarks.  Nose: Clear rhinorrhea, with some dried, crusty exudate adherent to mildly injected mucosa.  No purulent d/c.  No paranasal sinus TTP.  No facial swelling.  Throat and mouth without focal lesion.  No pharyngial swelling or exudate.  He has diffuse mild erythema of soft palate and pharynx.  Also slight dingy white substance on surface of tongue that scrapes off with tongue blade. Dentition in disrepair. Neck: supple, no LAD.   LUNGS: CTA bilat, nonlabored resps.   CV: RRR, no m/r/g. EXT: no c/c/e SKIN: no rash  LABS:    Chemistry   No results found for: NA, K, CL, CO2, BUN, CREATININE, GLU No results found for: CALCIUM, ALKPHOS, AST, ALT, BILITOT    Rapid strep today: neg  IMPRESSION AND PLAN:  URI with ST/laryngitis/minimal cough. Likely a component of allergic rhinitis from his work environment (dusty) as well. He seems to be turning the corner at  this time. Restart flonase, change to allegra 180 qd. Signs/symptoms to call or return for were reviewed and pt expressed understanding. Work excuse for 12/17 and 12/18, 2019 written for pt today.  An After Visit Summary was printed and given to the patient.  FOLLOW UP: Return if symptoms worsen or fail to improve.  Signed:  Santiago BumpersPhil Ly Wass, MD           05/07/2018

## 2018-07-23 ENCOUNTER — Encounter: Payer: Self-pay | Admitting: Family Medicine

## 2018-07-23 ENCOUNTER — Ambulatory Visit (INDEPENDENT_AMBULATORY_CARE_PROVIDER_SITE_OTHER): Payer: Managed Care, Other (non HMO) | Admitting: Family Medicine

## 2018-07-23 VITALS — BP 128/77 | HR 73 | Temp 97.9°F | Resp 16 | Ht 72.0 in | Wt 149.4 lb

## 2018-07-23 DIAGNOSIS — J029 Acute pharyngitis, unspecified: Secondary | ICD-10-CM

## 2018-07-23 LAB — POCT RAPID STREP A (OFFICE): Rapid Strep A Screen: NEGATIVE

## 2018-07-23 NOTE — Progress Notes (Signed)
OFFICE VISIT  07/23/2018   CC:  Chief Complaint  Patient presents with  . Sore Throat    fatigue, body aches, sweats   HPI:    Patient is a 23 y.o. Caucasian male who presents for sore throat and body aches. Onset 4 d/a ST, fatigue, body aches, HA.  No fevers.  Slight cough, also definite nasal congestion and runny nose. Slight nausea x 1d, gone now.  No vomiting or diarrhea. Appetite fine, fluid intake good. Taking flonase and claritin chronically, no additional otc meds for this. No recent airline or foreign travel.  No rashes.    Past Medical History:  Diagnosis Date  . Amblyopia ex anopsia of left eye   . Anxiety   . Depression   . External hemorrhoid, thrombosed 01/2016   No I&D required.  . Hay fever   . History of Osgood-Schlatter disease    bilat; chronic bilat knee pain--saw Dr. Hilda Lias 2018---improved signif with naprosyn.    Past Surgical History:  Procedure Laterality Date  . TONSILLECTOMY  2006  . TYMPANOSTOMY TUBE PLACEMENT  2000  . WRIST FRACTURE SURGERY  2010    Outpatient Medications Prior to Visit  Medication Sig Dispense Refill  . fluticasone (FLONASE) 50 MCG/ACT nasal spray Place 2 sprays into both nostrils daily. 16 g 6  . Loratadine (CLARITIN PO) Take by mouth as needed.    . naproxen (NAPROSYN) 500 MG tablet TAKE 1 TABLET (500 MG TOTAL) BY MOUTH 2 (TWO) TIMES DAILY WITH A MEAL. 60 tablet 4   No facility-administered medications prior to visit.     No Known Allergies  ROS As per HPI  PE: Blood pressure 128/77, pulse 73, temperature 97.9 F (36.6 C), temperature source Oral, resp. rate 16, height 6' (1.829 m), weight 149 lb 6 oz (67.8 kg), SpO2 98 %. VS: noted--normal. Gen: alert, NAD, NONTOXIC APPEARING. HEENT: eyes without injection, drainage, or swelling.  Ears: EACs clear, TMs with normal light reflex and landmarks.  Nose: Clear rhinorrhea, with some dried, crusty exudate adherent to mildly injected mucosa.  No purulent d/c.  No  paranasal sinus TTP.  No facial swelling.  Throat and mouth without focal lesion.  No pharyngial swelling or exudate but he has soft palate and posterior pharyngeal erythema diffusely.  Neck: supple, no LAD.   LUNGS: CTA bilat, nonlabored resps.   CV: RRR, no m/r/g. EXT: no c/c/e SKIN: no rash  LABS:  Rapid strep NEG  IMPRESSION AND PLAN:  Viral syndrome/ILI (but no fevers make influenza less likely). Symptomatic care discussed. Get otc generic robitussin DM OR Mucinex DM and use as directed on the packaging for cough and congestion. Use otc generic saline nasal spray 2-3 times per day to irrigate/moisturize your nasal passages. Rest, fluids. Signs/symptoms to call or return for were reviewed and pt expressed understanding.  An After Visit Summary was printed and given to the patient.  FOLLOW UP: Return if symptoms worsen or fail to improve.  Signed:  Santiago Bumpers, MD           07/23/2018

## 2018-07-23 NOTE — Patient Instructions (Signed)
Get otc generic robitussin DM OR Mucinex DM and use as directed on the packaging for cough and congestion. Use otc generic saline nasal spray 2-3 times per day to irrigate/moisturize your nasal passages.   

## 2018-08-18 ENCOUNTER — Ambulatory Visit: Payer: Self-pay | Admitting: Family Medicine

## 2018-08-18 NOTE — Telephone Encounter (Signed)
Pt called in c/o being very dizzy when he first gets out of bed or gets up from sitting.  He has to stand there and "gather himself" before he can walk.  He feels like he has to catch his breath while the dizziness is happening then it goes away.  He is also vomited once last night.  He is having stomach pain and nausea today.   No fever or cough.  I let him know about Dr. Milinda Cave doing E visits/phone calls instead of seeing people in the office due to the coronavirus pandemic going on now   He was agreeable to having someone call him.  I verified his mobile phone number and e mail.  His mobile number had changed  [phone].  I changed it in his chart.   I sent these notes to Dr. Milinda Cave.    Reason for Disposition . [1] MODERATE dizziness (e.g., interferes with normal activities) AND [2] has NOT been evaluated by physician for this  (Exception: dizziness caused by heat exposure, sudden standing, or poor fluid intake)  Answer Assessment - Initial Assessment Questions 1. RESPIRATORY STATUS: "Describe your breathing?" (e.g., wheezing, shortness of breath, unable to speak, severe coughing)      Hard to explain.   Sometimes when I stand up I have to catch my breath and wait for the dizziness to go away.    2. ONSET: "When did this breathing problem begin?"      Yesterday 3. PATTERN "Does the difficult breathing come and go, or has it been constant since it started?"      It comes and goes.   4. SEVERITY: "How bad is your breathing?" (e.g., mild, moderate, severe)    - MILD: No SOB at rest, mild SOB with walking, speaks normally in sentences, can lay down, no retractions, pulse < 100.    - MODERATE: SOB at rest, SOB with minimal exertion and prefers to sit, cannot lie down flat, speaks in phrases, mild retractions, audible wheezing, pulse 100-120.    - SEVERE: Very SOB at rest, speaks in single words, struggling to breathe, sitting hunched forward, retractions, pulse > 120      Sometimes worse  than others 5. RECURRENT SYMPTOM: "Have you had difficulty breathing before?" If so, ask: "When was the last time?" and "What happened that time?"      No 6. CARDIAC HISTORY: "Do you have any history of heart disease?" (e.g., heart attack, angina, bypass surgery, angioplasty)      No 7. LUNG HISTORY: "Do you have any history of lung disease?"  (e.g., pulmonary embolus, asthma, emphysema)     No 8. CAUSE: "What do you think is causing the breathing problem?"      I have no idea. 9. OTHER SYMPTOMS: "Do you have any other symptoms? (e.g., dizziness, runny nose, cough, chest pain, fever)     Dizziness.  I vomited last night.  10. PREGNANCY: "Is there any chance you are pregnant?" "When was your last menstrual period?"       N/A 11. TRAVEL: "Have you traveled out of the country in the last month?" (e.g., travel history, exposures)       No travels.  Answer Assessment - Initial Assessment Questions 1. DESCRIPTION: "Describe your dizziness."     Began Sunday.  It was mild.   Worse yesterday.   It mostly happens wen I stand up. 2. LIGHTHEADED: "Do you feel lightheaded?" (e.g., somewhat faint, woozy, weak upon standing)     Yes  It's bad when I first stand up and get out of bed. 3. VERTIGO: "Do you feel like either you or the room is spinning or tilting?" (i.e. vertigo)     Lightheaded dizzy only 4. SEVERITY: "How bad is it?"  "Do you feel like you are going to faint?" "Can you stand and walk?"   - MILD - walking normally   - MODERATE - interferes with normal activities (e.g., work, school)    - SEVERE - unable to stand, requires support to walk, feels like passing out now.      Mild after up a minute or two 5. ONSET:  "When did the dizziness begin?"     See above 6. AGGRAVATING FACTORS: "Does anything make it worse?" (e.g., standing, change in head position)     Getting up  Out of bed and out of a chair mostly. 7. HEART RATE: "Can you tell me your heart rate?" "How many beats in 15 seconds?"   (Note: not all patients can do this)       Not asked 8. CAUSE: "What do you think is causing the dizziness?"     I don't know 9. RECURRENT SYMPTOM: "Have you had dizziness before?" If so, ask: "When was the last time?" "What happened that time?"     No.   I have bad allergy this time of year. 10. OTHER SYMPTOMS: "Do you have any other symptoms?" (e.g., fever, chest pain, vomiting, diarrhea, bleeding)       Vomited once last night with stomach pains and nausea. 11. PREGNANCY: "Is there any chance you are pregnant?" "When was your last menstrual period?"       N/A  Protocols used: DIZZINESS - LIGHTHEADEDNESS-A-AH, BREATHING DIFFICULTY-A-AH

## 2018-08-19 ENCOUNTER — Other Ambulatory Visit: Payer: Self-pay

## 2018-08-19 ENCOUNTER — Encounter: Payer: Self-pay | Admitting: Family Medicine

## 2018-08-19 ENCOUNTER — Ambulatory Visit (INDEPENDENT_AMBULATORY_CARE_PROVIDER_SITE_OTHER): Payer: Managed Care, Other (non HMO) | Admitting: Family Medicine

## 2018-08-19 DIAGNOSIS — R110 Nausea: Secondary | ICD-10-CM

## 2018-08-19 DIAGNOSIS — K2900 Acute gastritis without bleeding: Secondary | ICD-10-CM

## 2018-08-19 DIAGNOSIS — R42 Dizziness and giddiness: Secondary | ICD-10-CM | POA: Diagnosis not present

## 2018-08-19 MED ORDER — PROMETHAZINE HCL 12.5 MG PO TABS: ORAL_TABLET | ORAL | 0 refills | Status: AC

## 2018-08-19 MED ORDER — PANTOPRAZOLE SODIUM 40 MG PO TBEC: DELAYED_RELEASE_TABLET | ORAL | 0 refills | Status: DC

## 2018-08-19 NOTE — Progress Notes (Signed)
Virtual Visit via Video Note  I connected with pt on 08/19/18 at 10:30 AM EDT by a video enabled telemedicine application and verified that I am speaking with the correct person using two identifiers.  Location patient: home Location provider:work office Persons participating in the virtual visit: patient, provider  I discussed the limitations of evaluation and management by telemedicine and the availability of in person appointments. The patient expressed understanding and agreed to proceed.   HPI: Dizzy spells and feeling SOB when standing up. Onset 5 days ago, orthostatic motion changes induced these sx's.  Got worse 2 d/a. No vertigo.  Nausea has been continuous but only vomited once.  No fevers. Slight nasal congestion, but no ST or cough. Describes inadequate hydration habits, esp at work. Urine light yellow.  Mouth not dry.  He does feel palpitations after he begins to feel lightheaded. No diarrhea and no constipation. Points to mid epigastric area and says he has pain there on and off quite a bit lately.  Eating makes it a bit worse. No known recent sick contacts. He takes naproxen 500mg  qhs-->3-4 nights per week- rx'd by orthopedist.  No diarrhea. No GERD/heartburn. 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 He missed work last night and also 08/17/2018.    ROS: See pertinent positives and negatives per HPI.  Past Medical History:  Diagnosis Date  . Amblyopia ex anopsia of left eye   . Anxiety   . Depression   . External hemorrhoid, thrombosed 01/2016   No I&D required.  . Hay fever   . History of Osgood-Schlatter disease    bilat; chronic bilat knee pain--saw Dr. Hilda Lias 2018---improved signif with naprosyn.    Past Surgical History:  Procedure Laterality Date  . TONSILLECTOMY  2006  . TYMPANOSTOMY TUBE PLACEMENT  2000  . WRIST FRACTURE SURGERY  2010    Family  History  Problem Relation Age of Onset  . Alcohol abuse Mother   . Drug abuse Mother   . Cancer Mother   . Depression Brother   . Allergies Brother   . Suicidality Brother   . Cancer Maternal Grandfather   . Hypertension Paternal Grandmother   . Cancer Paternal Grandfather   . Hypertension Paternal Grandfather   . Diabetes Paternal Grandfather   . Cancer Maternal Uncle        Current Outpatient Medications:  .  fluticasone (FLONASE) 50 MCG/ACT nasal spray, Place 2 sprays into both nostrils daily., Disp: 16 g, Rfl: 6 .  Loratadine (CLARITIN PO), Take by mouth as needed., Disp: , Rfl:  .  naproxen (NAPROSYN) 500 MG tablet, TAKE 1 TABLET (500 MG TOTAL) BY MOUTH 2 (TWO) TIMES DAILY WITH A MEAL., Disp: 60 tablet, Rfl: 4  EXAM:  VITALS per patient if applicable:  GENERAL: alert, oriented, appears well and in no acute distress  HEENT: atraumatic, conjunttiva clear, no obvious abnormalities on inspection of external nose and ears  NECK: normal movements of the head and neck  LUNGS: on inspection no signs of respiratory distress, breathing rate appears normal, no obvious gross SOB, gasping or wheezing Abd: non distended.  He denies tenderness when he palpates abdomen.  CV: no obvious cyanosis  MS: moves all visible extremities without noticeable abnormality  PSYCH/NEURO: pleasant and cooperative, no obvious depression or anxiety, speech and thought processing grossly intact  LABS: none today   ASSESSMENT AND PLAN:  Discussed the following  assessment and plan:  Suspect acute gastritis. Stop naproxen. Start pantoprazole 40mg  bid x 15d, then 1 qd x 15d. Promethazine 12.5mg , 1-2 q6h prn. Bland diet, advance as tolerated. No labs or imaging indicated at this time. Encouraged good intake of clear fluids. Work note for 3/30, 3/31, and today.   May return to work Friday 08/21/2018.    I discussed the assessment and treatment plan with the patient. The patient was provided an  opportunity to ask questions and all were answered. The patient agreed with the plan and demonstrated an understanding of the instructions.   The patient was advised to call back or seek an in-person evaluation if the symptoms worsen or if the condition fails to improve as anticipated.   F/u: 1 wk virtual visit  Signed:  Santiago Bumpers, MD           08/19/2018

## 2018-08-26 ENCOUNTER — Other Ambulatory Visit: Payer: Self-pay

## 2018-08-26 ENCOUNTER — Telehealth: Payer: Self-pay

## 2018-08-26 ENCOUNTER — Encounter: Payer: Managed Care, Other (non HMO) | Admitting: Family Medicine

## 2018-08-26 NOTE — Telephone Encounter (Signed)
Tried to contact patient for appt @1 :30. Unable to leave message, VM not set up.

## 2018-08-26 NOTE — Progress Notes (Signed)
Virtual Visit via Video Note  I connected with pt on 08/26/18 at  1:30 PM EDT by a video enabled telemedicine application and verified that I am speaking with the correct person using two identifiers.  Location patient: home Location provider:work or home office Persons participating in the virtual visit: patient, myself.  I discussed the limitations of evaluation and management by telemedicine and the availability of in person appointments. The patient expressed understanding and agreed to proceed.   HPI: He is here for 1 week f/u for sx's I felt like were due to acute gastritis. I had him stop naproxen, started pantoprazole 40mg  bid x 15d, then qd for 15d, rx'd promethazine for prn use, encouraged good intake of clear fluids.  I kept him out of work a couple of days.   ROS: See pertinent positives and negatives per HPI.  Past Medical History:  Diagnosis Date   Amblyopia ex anopsia of left eye    Anxiety    Depression    External hemorrhoid, thrombosed 01/2016   No I&D required.   Hay fever    History of Osgood-Schlatter disease    bilat; chronic bilat knee pain--saw Dr. Hilda Lias 2018---improved signif with naprosyn.    Past Surgical History:  Procedure Laterality Date   TONSILLECTOMY  2006   TYMPANOSTOMY TUBE PLACEMENT  2000   WRIST FRACTURE SURGERY  2010    Family History  Problem Relation Age of Onset   Alcohol abuse Mother    Drug abuse Mother    Cancer Mother    Depression Brother    Allergies Brother    Suicidality Brother    Cancer Maternal Grandfather    Hypertension Paternal Grandmother    Cancer Paternal Grandfather    Hypertension Paternal Grandfather    Diabetes Paternal Grandfather    Cancer Maternal Uncle      Current Outpatient Medications:    fluticasone (FLONASE) 50 MCG/ACT nasal spray, Place 2 sprays into both nostrils daily., Disp: 16 g, Rfl: 6   Loratadine (CLARITIN PO), Take by mouth as needed., Disp: , Rfl:    naproxen (NAPROSYN) 500 MG  tablet, TAKE 1 TABLET (500 MG TOTAL) BY MOUTH 2 (TWO) TIMES DAILY WITH A MEAL., Disp: 60 tablet, Rfl: 4   pantoprazole (PROTONIX) 40 MG tablet, 1 tab po bid x 15d, then 1 tab po qd x 15d, Disp: 45 tablet, Rfl: 0   promethazine (PHENERGAN) 12.5 MG tablet, 1-2 tabs po q6h prn nausea, Disp: 30 tablet, Rfl: 0  EXAM:  VITALS per patient if applicable:  GENERAL: alert, oriented, appears well and in no acute distress  HEENT: atraumatic, conjunttiva clear, no obvious abnormalities on inspection of external nose and ears  NECK: normal movements of the head and neck  LUNGS: on inspection no signs of respiratory distress, breathing rate appears normal, no obvious gross SOB, gasping or wheezing  CV: no obvious cyanosis  MS: moves all visible extremities without noticeable abnormality  PSYCH/NEURO: pleasant and cooperative, no obvious depression or anxiety, speech and thought processing grossly intact  LABS: none today  ASSESSMENT AND PLAN:  Discussed the following assessment and plan:  No diagnosis found.     I discussed the assessment and treatment plan with the patient. The patient was provided an opportunity to ask questions and all were answered. The patient agreed with the plan and demonstrated an understanding of the instructions.   The patient was advised to call back or seek an in-person evaluation if the symptoms worsen or if the  condition fails to improve as anticipated.  I provided  minutes of non-face-to-face time during this encounter.  F/u:   Signed:  Santiago BumpersPhil Issa Luster, MD           08/26/2018

## 2018-09-09 ENCOUNTER — Telehealth: Payer: Self-pay | Admitting: Family Medicine

## 2018-09-09 NOTE — Telephone Encounter (Signed)
Copied from CRM [id number]. Topic: General - Other >> Sep 09, 2018  3:42 PM Tamela Oddi wrote: Reason for CRM: Patient called to schedule a virtual appt. With the doctor.  Tried the office but they were on a call.  Please call patient back to schedule.  CB# [phone]

## 2018-09-10 ENCOUNTER — Ambulatory Visit (INDEPENDENT_AMBULATORY_CARE_PROVIDER_SITE_OTHER): Payer: Managed Care, Other (non HMO) | Admitting: Family Medicine

## 2018-09-10 ENCOUNTER — Telehealth: Payer: Self-pay

## 2018-09-10 ENCOUNTER — Encounter: Payer: Self-pay | Admitting: Family Medicine

## 2018-09-10 ENCOUNTER — Other Ambulatory Visit: Payer: Self-pay

## 2018-09-10 DIAGNOSIS — J301 Allergic rhinitis due to pollen: Secondary | ICD-10-CM

## 2018-09-10 DIAGNOSIS — J4520 Mild intermittent asthma, uncomplicated: Secondary | ICD-10-CM | POA: Diagnosis not present

## 2018-09-10 MED ORDER — ALBUTEROL SULFATE HFA 108 (90 BASE) MCG/ACT IN AERS: 2.0000 | INHALATION_SPRAY | RESPIRATORY_TRACT | 0 refills | Status: AC | PRN

## 2018-09-10 NOTE — Telephone Encounter (Signed)
Received fax from pt's pharmacy that Ventolin inhaler is not covered.  Please advise, thanks.

## 2018-09-10 NOTE — Telephone Encounter (Signed)
SW pt and made an appt with PCP for later this morning.

## 2018-09-10 NOTE — Progress Notes (Signed)
Virtual Visit via Video Note  I connected with pt  on 09/10/18 at  9:20 AM EDT by a video enabled telemedicine application and verified that I am speaking with the correct person using two identifiers.  Location patient: home Location provider:work or home office Persons participating in the virtual visit: patient, provider  I discussed the limitations of evaluation and management by telemedicine and the availability of in person appointments. The patient expressed understanding and agreed to proceed.   HPI: 23 y/o WM being seen today for recent wheezing. Has seasonal allergic rhinitis and this is his bad season. Has hx of mild asthma during this season as well, but has only had a remote hx of need for any albuterol rescue inhaler. He works in a [employer], [employer], says that 3 d/a when he was working in that environment he started coughing, chest felt tight, was wheezing, had to stop during work to rest several times due to feeling these sx's.  No albut inhaler available to him.  Since that time he has had only brief, mild recurrence of wheezing, but he has also not worked since that day b/c he chose to take a 2 wk "leave of absence". No fevers, no purulent mucous production.  No HAs or face pain.  ROS: See pertinent positives and negatives per HPI.  Past Medical History:  Diagnosis Date  . Amblyopia ex anopsia of left eye   . Anxiety   . Depression   . External hemorrhoid, thrombosed 01/2016   No I&D required.  . Hay fever   . History of Osgood-Schlatter disease    bilat; chronic bilat knee pain--saw Dr. Hilda LiasKeeling 2018---improved signif with naprosyn.    Past Surgical History:  Procedure Laterality Date  . TONSILLECTOMY  2006  . TYMPANOSTOMY TUBE PLACEMENT  2000  . WRIST FRACTURE SURGERY  2010    Family History  Problem Relation Age of Onset  . Alcohol abuse Mother   . Drug abuse Mother   . Cancer Mother   . Depression Brother   . Allergies Brother   . Suicidality Brother    . Cancer Maternal Grandfather   . Hypertension Paternal Grandmother   . Cancer Paternal Grandfather   . Hypertension Paternal Grandfather   . Diabetes Paternal Grandfather   . Cancer Maternal Uncle      Current Outpatient Medications:  .  fluticasone (FLONASE) 50 MCG/ACT nasal spray, Place 2 sprays into both nostrils daily., Disp: 16 g, Rfl: 6 .  Loratadine (CLARITIN PO), Take by mouth as needed., Disp: , Rfl:  .  naproxen (NAPROSYN) 500 MG tablet, TAKE 1 TABLET (500 MG TOTAL) BY MOUTH 2 (TWO) TIMES DAILY WITH A MEAL., Disp: 60 tablet, Rfl: 4 .  pantoprazole (PROTONIX) 40 MG tablet, 1 tab po bid x 15d, then 1 tab po qd x 15d, Disp: 45 tablet, Rfl: 0 .  promethazine (PHENERGAN) 12.5 MG tablet, 1-2 tabs po q6h prn nausea, Disp: 30 tablet, Rfl: 0  EXAM:  VITALS per patient if applicable: There were no vitals taken for this visit.   GENERAL: alert, oriented, appears well and in no acute distress  HEENT: atraumatic, conjunttiva clear, no obvious abnormalities on inspection of external nose and ears  NECK: normal movements of the head and neck  LUNGS: on inspection no signs of respiratory distress, breathing rate appears normal, no obvious gross SOB, gasping or wheezing  CV: no obvious cyanosis  MS: moves all visible extremities without noticeable abnormality  PSYCH/NEURO: pleasant and cooperative, no obvious  depression or anxiety, speech and thought processing grossly intact  LABS: none today  ASSESSMENT AND PLAN:  Discussed the following assessment and plan:  Mild intermittent asthma, with seasonal allergic rhinitis. Continue daily claritin and flonase. Add albuterol HFA to use 2 p q4h prn wheezing/chest tightness/dry cough. If using more than a couple of days a week or if sx's not responding to albuterol, he'll let me know so we can take necessary steps to treat more aggressively as appropriate.  I discussed the assessment and treatment plan with the patient. The patient  was provided an opportunity to ask questions and all were answered. The patient agreed with the plan and demonstrated an understanding of the instructions.   The patient was advised to call back or seek an in-person evaluation if the symptoms worsen or if the condition fails to improve as anticipated.  F/u: 6 mo RCI  Signed:  Santiago Bumpers, MD           09/10/2018

## 2018-09-10 NOTE — Telephone Encounter (Signed)
Pls call [facility] and tell them that I put a note on his rx that states "may dispense  different brand albuterol inhaler if insurer requires".   If this is not sufficient, then all I can do is guess a different brand to eRx.-thx

## 2018-09-11 ENCOUNTER — Other Ambulatory Visit: Payer: Self-pay | Admitting: Family Medicine

## 2018-09-11 NOTE — Telephone Encounter (Signed)
Great thx

## 2018-09-11 NOTE — Telephone Encounter (Signed)
FYI: SW Chase at [employer], was able to reprocess Rx and found Proair inhaler is covered. Pt was notified.

## 2018-09-28 ENCOUNTER — Other Ambulatory Visit: Payer: Self-pay

## 2018-09-28 MED ORDER — PANTOPRAZOLE SODIUM 40 MG PO TBEC: 40.0000 mg | DELAYED_RELEASE_TABLET | Freq: Every day | ORAL | 1 refills | Status: DC

## 2018-09-28 NOTE — Telephone Encounter (Signed)
Refill sent to pharmacy.   

## 2018-09-28 NOTE — Telephone Encounter (Signed)
RF request for Pantoprazole 40mg  tabs  LOV: 08/26/2018 Next ov: Not scheduled  Last written: 09/11/2018 #45 w/ 0 refills   Would you like this refilled and change RX to 40mg  qd?  Please advise.

## 2018-09-28 NOTE — Telephone Encounter (Signed)
Yes change to 40 qd and disp #30, RF x 1.-thx

## 2018-09-30 ENCOUNTER — Other Ambulatory Visit: Payer: Self-pay | Admitting: Family Medicine

## 2018-09-30 MED ORDER — PANTOPRAZOLE SODIUM 40 MG PO TBEC: 40.0000 mg | DELAYED_RELEASE_TABLET | Freq: Every day | ORAL | 1 refills | Status: AC

## 2018-10-07 ENCOUNTER — Telehealth: Payer: Self-pay

## 2018-10-07 ENCOUNTER — Encounter: Payer: Self-pay | Admitting: Family Medicine

## 2018-10-07 ENCOUNTER — Other Ambulatory Visit: Payer: Self-pay

## 2018-10-07 ENCOUNTER — Ambulatory Visit (INDEPENDENT_AMBULATORY_CARE_PROVIDER_SITE_OTHER): Payer: Managed Care, Other (non HMO) | Admitting: Family Medicine

## 2018-10-07 DIAGNOSIS — M25519 Pain in unspecified shoulder: Secondary | ICD-10-CM

## 2018-10-07 DIAGNOSIS — M542 Cervicalgia: Secondary | ICD-10-CM

## 2018-10-07 DIAGNOSIS — M5412 Radiculopathy, cervical region: Secondary | ICD-10-CM

## 2018-10-07 MED ORDER — PREDNISONE 20 MG PO TABS: ORAL_TABLET | ORAL | 0 refills | Status: AC

## 2018-10-07 NOTE — Telephone Encounter (Signed)
I did not discuss this with him. He mentioned he may have to work something out with his employer. Pls ask what he thinks he can do at work, and if he says HE IS UNABLE to work for a while then he will have to tell me how long so I can fill out FMLA.  He will need to have his employer send me FMLA papers to fill out.-thx

## 2018-10-07 NOTE — Telephone Encounter (Signed)
Copied from CRM [id number]. Topic: General - Call Back - No Documentation >> Oct 07, 2018  1:45 PM Randol Kern wrote: Reason for CRM: Pt requesting call back from provider. He has questions as to whether or not he can work and how soon he can get scheduled for Xrays. Please advise  Best Contact: [phone]  Does pt need to be out of work? I did not see in your notes where you mention anything. I can check on Xray status.  Please advise

## 2018-10-07 NOTE — Progress Notes (Signed)
Virtual Visit via Video Note  I connected with pt on 10/07/18 at  9:40 AM EDT by a video enabled telemedicine application and verified that I am speaking with the correct person using two identifiers.  Location patient: home Location provider:work or home office Persons participating in the virtual visit: patient, provider  I discussed the limitations of evaluation and management by telemedicine and the availability of in person appointments. The patient expressed understanding and agreed to proceed.  Telemedicine visit is a necessity given the COVID-19 restrictions in place at the current time.  HPI: 23 y/o WM being seen today for R shoulder pain and neck pain. Onset 6 nights ago while at work, gradually worsening.  No acute strain or trauma recalled. Location is top of R trap region and radiates up into R side of neck and sometimes goes down arm.  Intermittent paresthesias down R arm into R hand.  Question of mild R arm weakness when at work a few days ago.  Moving neck too far to R or L hurts the area worse, also if lifts R arm up and out too much. Has not taken any med for pain at all.  No paresthesias or weakness in legs.    Past Medical History:  Diagnosis Date  . Amblyopia ex anopsia of left eye   . Anxiety   . Depression   . External hemorrhoid, thrombosed 01/2016   No I&D required.  Marland Kitchen History of Osgood-Schlatter disease    bilat; chronic bilat knee pain--saw Dr. Hilda Lias 2018---improved signif with naprosyn.  . Mild intermittent asthma   . Seasonal allergic rhinitis     Past Surgical History:  Procedure Laterality Date  . TONSILLECTOMY  2006  . TYMPANOSTOMY TUBE PLACEMENT  2000  . WRIST FRACTURE SURGERY  2010    Family History  Problem Relation Age of Onset  . Alcohol abuse Mother   . Drug abuse Mother   . Cancer Mother   . Depression Brother   . Allergies Brother   . Suicidality Brother   . Cancer Maternal Grandfather   . Hypertension Paternal Grandmother   .  Cancer Paternal Grandfather   . Hypertension Paternal Grandfather   . Diabetes Paternal Grandfather   . Cancer Maternal Uncle      Current Outpatient Medications:  .  albuterol (VENTOLIN HFA) 108 (90 Base) MCG/ACT inhaler, Inhale 2 puffs into the lungs every 4 (four) hours as needed for wheezing or shortness of breath., Disp: 1 Inhaler, Rfl: 0 .  fluticasone (FLONASE) 50 MCG/ACT nasal spray, Place 2 sprays into both nostrils daily., Disp: 16 g, Rfl: 6 .  Loratadine (CLARITIN PO), Take by mouth as needed., Disp: , Rfl:  .  naproxen (NAPROSYN) 500 MG tablet, TAKE 1 TABLET (500 MG TOTAL) BY MOUTH 2 (TWO) TIMES DAILY WITH A MEAL., Disp: 60 tablet, Rfl: 4 .  pantoprazole (PROTONIX) 40 MG tablet, Take 1 tablet (40 mg total) by mouth daily. Take one tablet by mouth daily, Disp: 90 tablet, Rfl: 1 .  promethazine (PHENERGAN) 12.5 MG tablet, 1-2 tabs po q6h prn nausea, Disp: 30 tablet, Rfl: 0  EXAM:  VITALS per patient if applicable: There were no vitals taken for this visit.   GENERAL: alert, oriented, appears well and in no acute distress  HEENT: atraumatic, conjunttiva clear, no obvious abnormalities on inspection of external nose and ears  NECK: normal movements of the head and neck  LUNGS: on inspection no signs of respiratory distress, breathing rate appears normal, no obvious  gross SOB, gasping or wheezing  CV: no obvious cyanosis  MS: moves all visible extremities without noticeable abnormality  PSYCH/NEURO: pleasant and cooperative, no obvious depression or anxiety, speech and thought processing grossly intact  LABS: none today  ASSESSMENT AND PLAN:  Discussed the following assessment and plan:  Right sided neck pain, with some intermittent sx's of R UE radiculopathy. Not very clear whether this is musculoskeletal pain vs acute cervical radiculopathy. Given unclear nature, will check C-spine plain films. [facility] Prednisone 40mg  qd x 5d rx'd.  He can  take ibuprofen 600 mg bid with food AFTER he finishes the prednisone. At this point he has missed work 5/18 and 5/19, 2020.  He will discuss with his employer how his current sx's will affect any work plans for the immediate future.  I discussed the assessment and treatment plan with the patient. The patient was provided an opportunity to ask questions and all were answered. The patient agreed with the plan and demonstrated an understanding of the instructions.   The patient was advised to call back or seek an in-person evaluation if the symptoms worsen or if the condition fails to improve as anticipated.  F/u: open ended at this time  Signed:  Santiago BumpersPhil Jameis Newsham, MD           10/07/2018

## 2018-10-08 ENCOUNTER — Ambulatory Visit (HOSPITAL_COMMUNITY)
Admission: RE | Admit: 2018-10-08 | Discharge: 2018-10-08 | Disposition: A | Discharge status: Home / Self Care | Payer: Managed Care, Other (non HMO) | Source: Ambulatory Visit | Attending: Family Medicine | Admitting: Family Medicine

## 2018-10-08 ENCOUNTER — Encounter: Payer: Self-pay | Admitting: Family Medicine

## 2018-10-08 DIAGNOSIS — M25519 Pain in unspecified shoulder: Secondary | ICD-10-CM | POA: Diagnosis present

## 2018-10-08 DIAGNOSIS — M542 Cervicalgia: Secondary | ICD-10-CM | POA: Insufficient documentation

## 2018-10-08 NOTE — Telephone Encounter (Signed)
Patient called back. He can go back to work for light duty starting tomorrow 10/09/18 if he has a note. Can the note write him out of work 5/18, 19, 20th returning to light duty work 10/09/18 -10/16/18.  Letter can be emailed to [email or web address]. Thanks

## 2018-10-08 NOTE — Telephone Encounter (Signed)
Called pt and was unable to leave a message on VM. Will try again.

## 2018-10-08 NOTE — Telephone Encounter (Signed)
Letter written. Diane, can you e-mail this letter for me?

## 2018-10-09 ENCOUNTER — Telehealth: Payer: Self-pay

## 2018-10-09 NOTE — Telephone Encounter (Signed)
My Chart message sent

## 2018-10-09 NOTE — Telephone Encounter (Signed)
I've emailed the letter.

## 2018-10-13 ENCOUNTER — Ambulatory Visit (HOSPITAL_COMMUNITY): Payer: Managed Care, Other (non HMO)

## 2018-10-15 ENCOUNTER — Telehealth (HOSPITAL_COMMUNITY): Payer: Self-pay | Admitting: Family Medicine

## 2018-10-15 NOTE — Telephone Encounter (Signed)
10/15/18  pt left a message to cx - no reason was given but he did ask that we call him back and I will call him this morning

## 2018-10-16 ENCOUNTER — Ambulatory Visit (HOSPITAL_COMMUNITY): Payer: Managed Care, Other (non HMO) | Admitting: Occupational Therapy

## 2018-10-16 ENCOUNTER — Encounter (HOSPITAL_COMMUNITY): Payer: Self-pay

## 2021-04-02 IMAGING — DX CERVICAL SPINE - COMPLETE 4+ VIEW
5 series · 5 of 5 positions shown · non-contrast
Comparison: None.

CLINICAL DATA: Right neck and shoulder pain.

EXAM:
CERVICAL SPINE - COMPLETE 4+ VIEW

[c-spine lat]
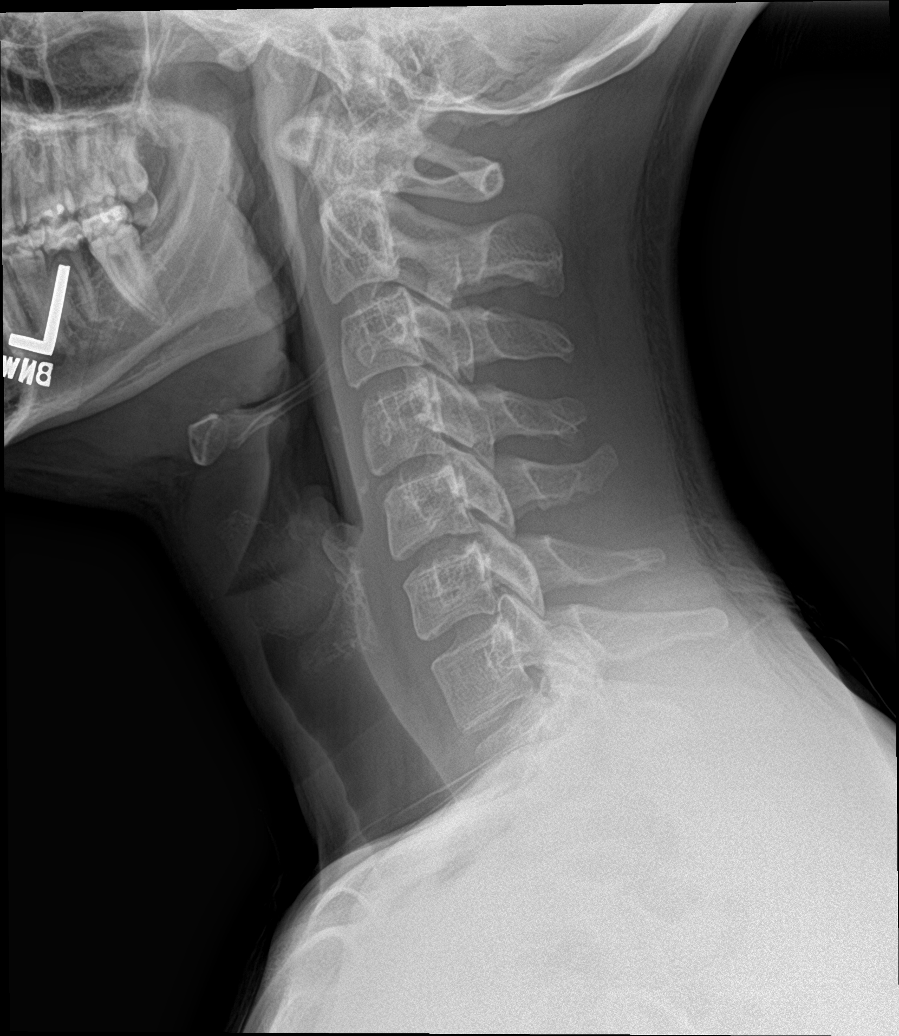

[c-spine obl (1 of 2)]
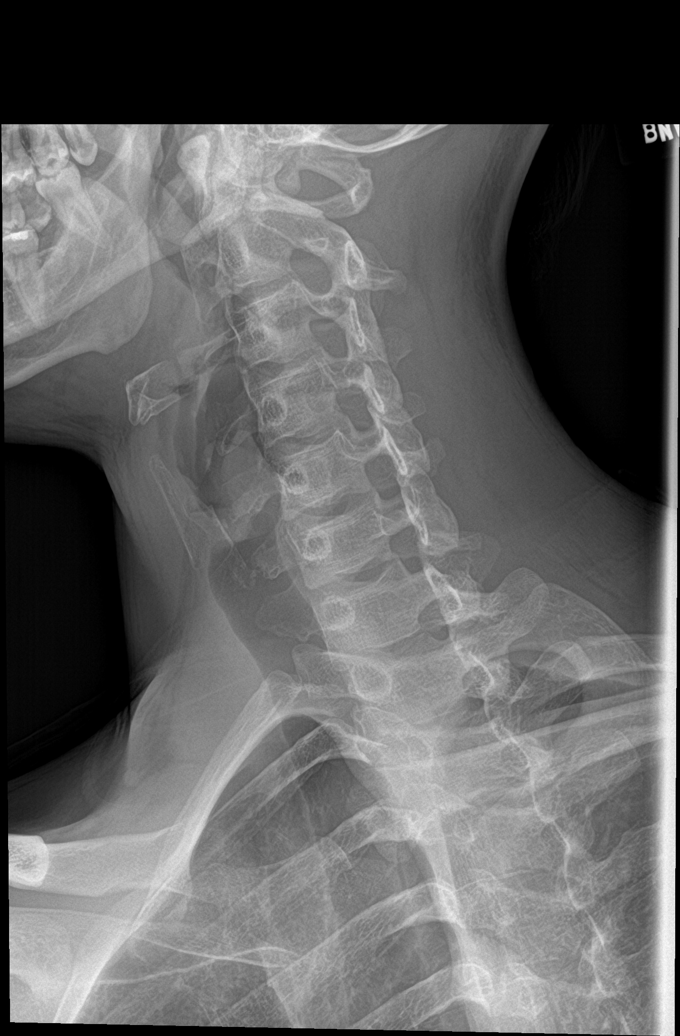

[c-spine obl (2 of 2)]
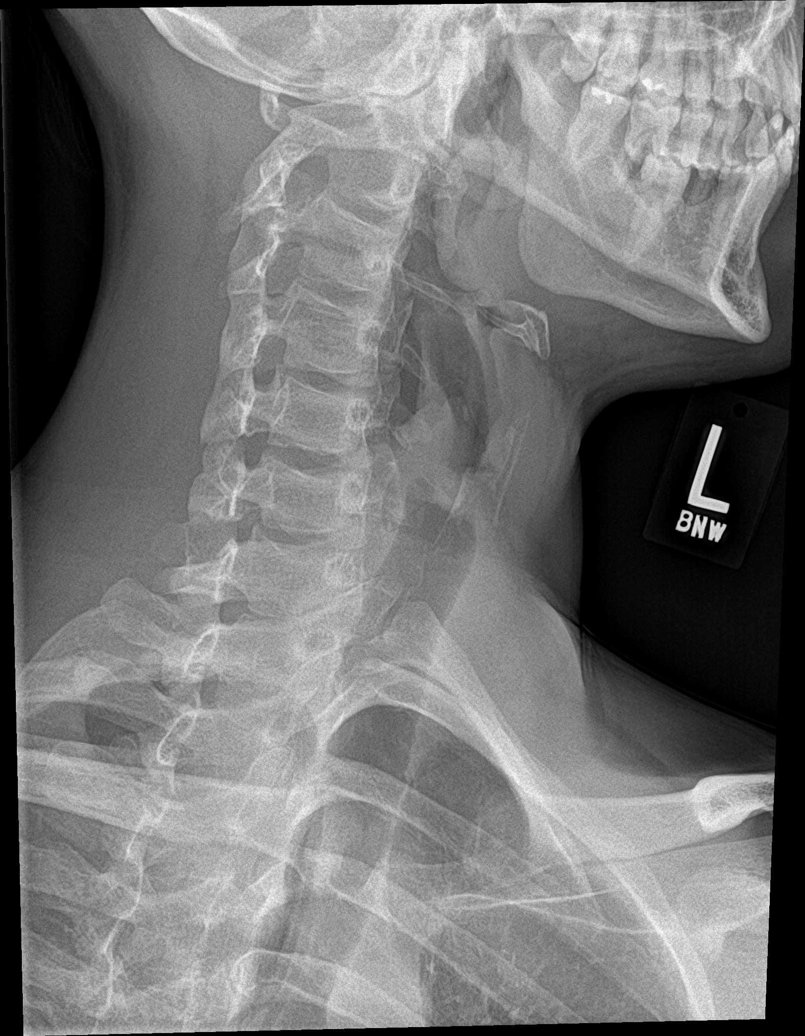

[c-spine open mouth]
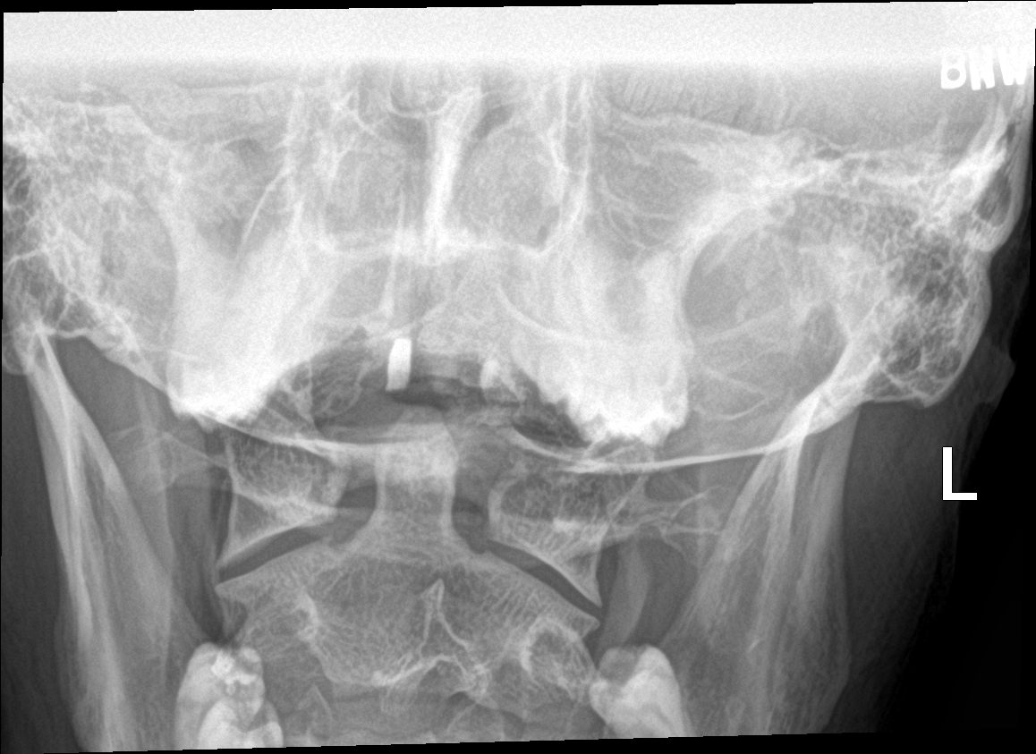

[c-spine ap]
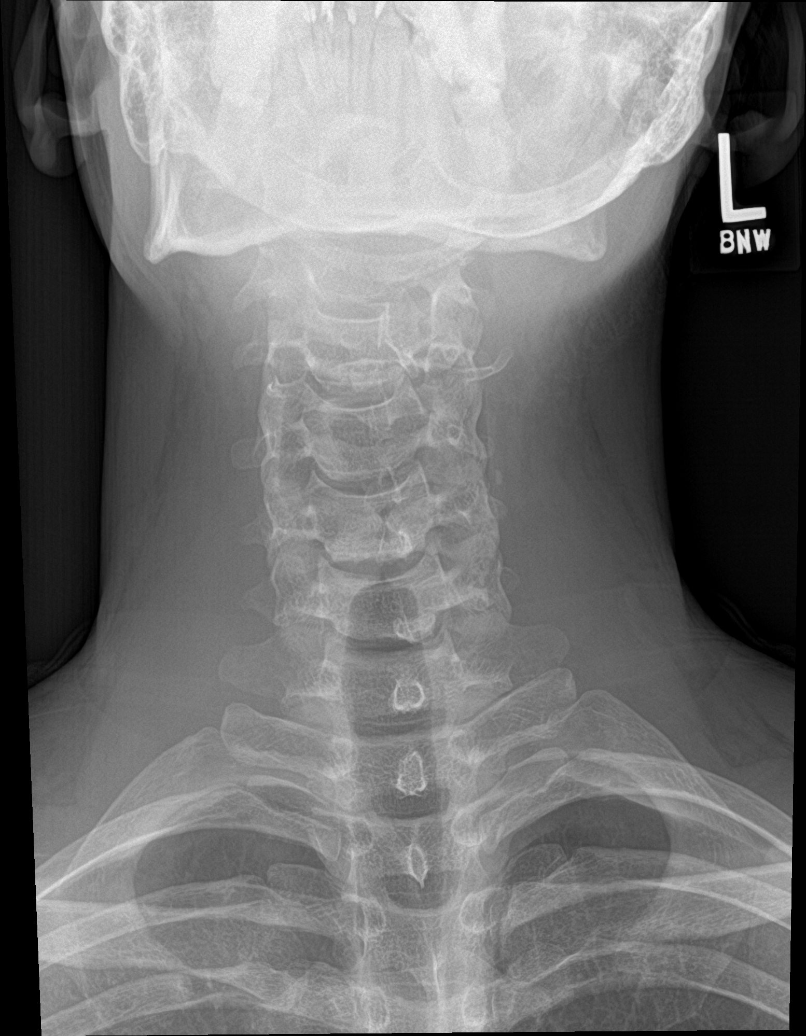

[5 of 5 positions shown; findings below may reference images not displayed]

FINDINGS: There is no evidence of cervical spine fracture or prevertebral soft
tissue swelling. Alignment is normal. No other significant bone
abnormalities are identified.
IMPRESSION: Negative cervical spine radiographs.
